# Patient Record
Sex: Female | Born: 1985 | Race: Black or African American | Hispanic: No | Marital: Single | Smoking: Never smoker
Health system: Southern US, Community
[De-identification: ages and names within clinical notes are randomized; demographics above are authoritative.]

## PROBLEM LIST (undated history)

## (undated) HISTORY — PX: BREAST LUMPECTOMY: SHX2

---

## 2015-06-30 ENCOUNTER — Emergency Department (HOSPITAL_COMMUNITY): Payer: Self-pay

## 2015-06-30 ENCOUNTER — Emergency Department (HOSPITAL_COMMUNITY)
Admission: EM | Admit: 2015-06-30 | Discharge: 2015-06-30 | Disposition: A | Discharge status: Home / Self Care | Payer: Self-pay | Attending: Emergency Medicine | Admitting: Emergency Medicine

## 2015-06-30 ENCOUNTER — Encounter (HOSPITAL_COMMUNITY): Payer: Self-pay | Admitting: Emergency Medicine

## 2015-06-30 DIAGNOSIS — J069 Acute upper respiratory infection, unspecified: Secondary | ICD-10-CM | POA: Insufficient documentation

## 2015-06-30 DIAGNOSIS — R197 Diarrhea, unspecified: Secondary | ICD-10-CM | POA: Insufficient documentation

## 2015-06-30 DIAGNOSIS — Z3202 Encounter for pregnancy test, result negative: Secondary | ICD-10-CM | POA: Insufficient documentation

## 2015-06-30 LAB — URINALYSIS, ROUTINE W REFLEX MICROSCOPIC
Bilirubin Urine: NEGATIVE
GLUCOSE, UA: NEGATIVE mg/dL
Hgb urine dipstick: NEGATIVE
Ketones, ur: NEGATIVE mg/dL
LEUKOCYTES UA: NEGATIVE
Nitrite: NEGATIVE
PROTEIN: NEGATIVE mg/dL
Specific Gravity, Urine: 1.004 — ABNORMAL LOW (ref 1.005–1.030)
UROBILINOGEN UA: 0.2 mg/dL (ref 0.0–1.0)
pH: 8 (ref 5.0–8.0)

## 2015-06-30 LAB — COMPREHENSIVE METABOLIC PANEL
ALK PHOS: 49 U/L (ref 38–126)
ALT: 24 U/L (ref 14–54)
ANION GAP: 10 (ref 5–15)
AST: 26 U/L (ref 15–41)
Albumin: 3.8 g/dL (ref 3.5–5.0)
BUN: <5 mg/dL — ABNORMAL LOW (ref 6–20)
CALCIUM: 9.6 mg/dL (ref 8.9–10.3)
CO2: 23 mmol/L (ref 22–32)
Chloride: 106 mmol/L (ref 101–111)
Creatinine, Ser: 0.88 mg/dL (ref 0.44–1.00)
GFR calc non Af Amer: >60 mL/min (ref >60)
Glucose, Bld: 90 mg/dL (ref 65–99)
POTASSIUM: 4.5 mmol/L (ref 3.5–5.1)
SODIUM: 139 mmol/L (ref 135–145)
Total Bilirubin: 0.5 mg/dL (ref 0.3–1.2)
Total Protein: 7.3 g/dL (ref 6.5–8.1)

## 2015-06-30 LAB — CBC
HCT: 39.4 % (ref 36.0–46.0)
HEMOGLOBIN: 12.2 g/dL (ref 12.0–15.0)
MCH: 25.9 pg — AB (ref 26.0–34.0)
MCHC: 31 g/dL (ref 30.0–36.0)
MCV: 83.7 fL (ref 78.0–100.0)
Platelets: 177 10*3/uL (ref 150–400)
RBC: 4.71 MIL/uL (ref 3.87–5.11)
RDW: 13.4 % (ref 11.5–15.5)
WBC: 5.9 10*3/uL (ref 4.0–10.5)

## 2015-06-30 LAB — I-STAT BETA HCG BLOOD, ED (MC, WL, AP ONLY): I-stat hCG, quantitative: <5 m[IU]/mL (ref <5)

## 2015-06-30 LAB — LIPASE, BLOOD: LIPASE: 39 U/L (ref 22–51)

## 2015-06-30 MED ORDER — SODIUM CHLORIDE 0.9 % IV BOLUS (SEPSIS)
1000.0000 mL | Freq: Once | INTRAVENOUS | Status: AC
  Administered 2015-06-30: 1000 mL via INTRAVENOUS

## 2015-06-30 MED ORDER — KETOROLAC TROMETHAMINE 30 MG/ML IJ SOLN
30.0000 mg | Freq: Once | INTRAMUSCULAR | Status: AC
  Administered 2015-06-30: 30 mg via INTRAVENOUS
  Filled 2015-06-30: qty 1

## 2015-06-30 NOTE — ED Provider Notes (Signed)
CSN: [id number]     Arrival date & time 06/30/15  1015 History   First MD Initiated Contact with Patient 06/30/15 1107     Chief Complaint  Patient presents with  . Diarrhea  . URI     (Consider location/radiation/quality/duration/timing/severity/associated sxs/prior Treatment) HPI  29 year old female , otherwise healthy, who presents with cough, congestion, and diarrhea. 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  Initially had a dry cough that developed 2 weeks ago. Over the past week she has had productive cough, with congestion, hoarseness, and myalgias. This is also associated with tension headache. Reports that she had diarrhea starting last night, total 4 episodes. Has not had any nausea , vomiting, or abdominal pain. Denies any dysuria or urinary frequency.   History reviewed. No pertinent past medical history. Past Surgical History  Procedure Laterality Date  . Breast lumpectomy     History reviewed. No pertinent family history. Social History  Substance Use Topics  . Smoking status: Never Smoker   . Smokeless tobacco: None  . Alcohol Use: No   OB History    No data available     Review of Systems 10/14 systems reviewed and are negative other than those stated in the HPI    Allergies  Codeine  Home Medications   Prior to Admission medications   Not on File   BP 107/61 mmHg  Pulse 83  Temp(Src) 99.2 F (37.3 C) (Oral)  Resp 16  Ht 5' 5.5" (1.664 m)  Wt 185 lb (83.915 kg)  BMI 30.31 kg/m2  SpO2 100%  LMP 06/19/2015 (Exact Date) Physical Exam Physical Exam  Nursing note and vitals reviewed. Constitutional: Well developed, well nourished, non-toxic, and in no acute distress Head: Normocephalic and atraumatic.  Mouth/Throat: Oropharynx is clear and moist.  Neck: Normal range of motion. Neck supple. No meningismus. Cardiovascular: Normal rate and regular rhythm.   Pulmonary/Chest: Effort normal  and breath sounds normal.  Abdominal: Soft. There is no tenderness. There is no rebound and no guarding.  Musculoskeletal: Normal range of motion.  Neurological: Alert, no facial droop, fluent speech, moves all extremities symmetrically Skin: Skin is warm and dry.  Psychiatric: Cooperative  ED Course  Procedures (including critical care time) Labs Review Labs Reviewed  COMPREHENSIVE METABOLIC PANEL - Abnormal; Notable for the following:    BUN <5 (*)    All other components within normal limits  CBC - Abnormal; Notable for the following:    MCH 25.9 (*)    All other components within normal limits  URINALYSIS, ROUTINE W REFLEX MICROSCOPIC (NOT AT [facility]) - Abnormal; Notable for the following:    Specific Gravity, Urine 1.004 (*)    All other components within normal limits  LIPASE, BLOOD  I-STAT BETA HCG BLOOD, ED ([facility], [facility], [facility] ONLY)    Imaging Review Dg Chest 2 View  06/30/2015   CLINICAL DATA:  Cough, chills  EXAM: CHEST  2 VIEW  COMPARISON:  None.  FINDINGS: The heart size and mediastinal contours are within normal limits. Both lungs are clear. The visualized skeletal structures are unremarkable.  IMPRESSION: No active cardiopulmonary disease.   Electronically Signed   By: Elige Ko   On: 06/30/2015 11:59   I have personally reviewed and evaluated these images and lab results as part of my medical decision-making.   MDM   Final diagnoses:  Diarrhea of presumed infectious origin  Upper respiratory infection    In short, this  is 29 year old female presenting with flu like symptoms. She is well appearing with unremarkable exam. Blood work and UA unremarkable. Symptoms improved with IV fluids and toradol. CXR showing no PNA. Discussed supportive care management for home. Strict return and follow-up instructions reviewed. She expressed understanding of all discharge instructions and felt comfortable with the plan of care.     Lavera Guise, MD 06/30/15 2010

## 2015-06-30 NOTE — Discharge Instructions (Signed)
Drink plenty of fluids. Return without fail for worsening symptoms, including worsening pain, vomiting and unable to keep down food/fluids, or any other symptoms concerning to you.   Upper Respiratory Infection, Adult Most upper respiratory infections (URIs) are caused by a virus. A URI affects the nose, throat, and upper air passages. The most common type of URI is often called "the common cold." HOME CARE   Take medicines only as told by your doctor.  Gargle warm saltwater or take cough drops to comfort your throat as told by your doctor.  Use a warm mist humidifier or inhale steam from a shower to increase air moisture. This may make it easier to breathe.  Drink enough fluid to keep your pee (urine) clear or pale yellow.  Eat soups and other clear broths.  Have a healthy diet.  Rest as needed.  Go back to work when your fever is gone or your doctor says it is okay.  You may need to stay home longer to avoid giving your URI to others.  You can also wear a face mask and wash your hands often to prevent spread of the virus.  Use your inhaler more if you have asthma.  Do not use any tobacco products, including cigarettes, chewing tobacco, or electronic cigarettes. If you need help quitting, ask your doctor. GET HELP IF:  You are getting worse, not better.  Your symptoms are not helped by medicine.  You have chills.  You are getting more short of breath.  You have brown or red mucus.  You have yellow or brown discharge from your nose.  You have pain in your face, especially when you bend forward.  You have a fever.  You have puffy (swollen) neck glands.  You have pain while swallowing.  You have white areas in the back of your throat. GET HELP RIGHT AWAY IF:   You have very bad or constant:  Headache.  Ear pain.  Pain in your forehead, behind your eyes, and over your cheekbones (sinus pain).  Chest pain.  You have long-lasting (chronic) lung disease and  any of the following:  Wheezing.  Long-lasting cough.  Coughing up blood.  A change in your usual mucus.  You have a stiff neck.  You have changes in your:  Vision.  Hearing.  Thinking.  Mood. MAKE SURE YOU:   Understand these instructions.  Will watch your condition.  Will get help right away if you are not doing well or get worse.   This information is not intended to replace advice given to you by your health care provider. Make sure you discuss any questions you have with your health care provider.   Document Released: 02/26/2008 Document Revised: 01/24/2015 Document Reviewed: 12/15/2013 Elsevier Interactive Patient Education 2016 [employer]  Diarrhea Diarrhea is frequent loose and watery bowel movements. It can cause you to feel weak and dehydrated. Dehydration can cause you to become tired and thirsty, have a dry mouth, and have decreased urination that often is dark yellow. Diarrhea is a sign of another problem, most often an infection that will not last long. In most cases, diarrhea typically lasts 2-3 days. However, it can last longer if it is a sign of something more serious. It is important to treat your diarrhea as directed by your caregiver to lessen or prevent future episodes of diarrhea. CAUSES  Some common causes include:  Gastrointestinal infections caused by viruses, bacteria, or parasites.  Food poisoning or food allergies.  Certain medicines,  such as antibiotics, chemotherapy, and laxatives.  Artificial sweeteners and fructose.  Digestive disorders. HOME CARE INSTRUCTIONS  Ensure adequate fluid intake (hydration): Have 1 cup (8 oz) of fluid for each diarrhea episode. Avoid fluids that contain simple sugars or sports drinks, fruit juices, whole milk products, and sodas. Your urine should be clear or pale yellow if you are drinking enough fluids. Hydrate with an oral rehydration solution that you can purchase at pharmacies, retail stores, and  online. You can prepare an oral rehydration solution at home by mixing the following ingredients together:   - tsp table salt.   tsp baking soda.   tsp salt substitute containing potassium chloride.  1  tablespoons sugar.  1 L (34 oz) of water.  Certain foods and beverages may increase the speed at which food moves through the gastrointestinal (GI) tract. These foods and beverages should be avoided and include:  Caffeinated and alcoholic beverages.  High-fiber foods, such as raw fruits and vegetables, nuts, seeds, and whole grain breads and cereals.  Foods and beverages sweetened with sugar alcohols, such as xylitol, sorbitol, and mannitol.  Some foods may be well tolerated and may help thicken stool including:  Starchy foods, such as rice, toast, pasta, low-sugar cereal, oatmeal, grits, baked potatoes, crackers, and bagels.  Bananas.  Applesauce.  Add probiotic-rich foods to help increase healthy bacteria in the GI tract, such as yogurt and fermented milk products.  Wash your hands well after each diarrhea episode.  Only take over-the-counter or prescription medicines as directed by your caregiver.  Take a warm bath to relieve any burning or pain from frequent diarrhea episodes. SEEK IMMEDIATE MEDICAL CARE IF:   You are unable to keep fluids down.  You have persistent vomiting.  You have blood in your stool, or your stools are black and tarry.  You do not urinate in 6-8 hours, or there is only a small amount of very dark urine.  You have abdominal pain that increases or localizes.  You have weakness, dizziness, confusion, or light-headedness.  You have a severe headache.  Your diarrhea gets worse or does not get better.  You have a fever or persistent symptoms for more than 2-3 days.  You have a fever and your symptoms suddenly get worse. MAKE SURE YOU:   Understand these instructions.  Will watch your condition.  Will get help right away if you are not  doing well or get worse.   This information is not intended to replace advice given to you by your health care provider. Make sure you discuss any questions you have with your health care provider.   Document Released: 08/30/2002 Document Revised: 09/30/2014 Document Reviewed: 05/17/2012 Elsevier Interactive Patient Education [employer]

## 2015-06-30 NOTE — ED Notes (Signed)
Pt c/o cold symptoms and diarrhea x 2 weeks. 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

## 2015-10-19 ENCOUNTER — Emergency Department (HOSPITAL_COMMUNITY)
Admission: EM | Admit: 2015-10-19 | Discharge: 2015-10-19 | Disposition: A | Discharge status: Home / Self Care | Payer: Self-pay | Attending: Emergency Medicine | Admitting: Emergency Medicine

## 2015-10-19 ENCOUNTER — Encounter (HOSPITAL_COMMUNITY): Payer: Self-pay | Admitting: Emergency Medicine

## 2015-10-19 ENCOUNTER — Emergency Department (HOSPITAL_COMMUNITY): Payer: Medicaid Other

## 2015-10-19 DIAGNOSIS — N858 Other specified noninflammatory disorders of uterus: Secondary | ICD-10-CM | POA: Insufficient documentation

## 2015-10-19 DIAGNOSIS — R1031 Right lower quadrant pain: Secondary | ICD-10-CM

## 2015-10-19 DIAGNOSIS — Z3202 Encounter for pregnancy test, result negative: Secondary | ICD-10-CM | POA: Insufficient documentation

## 2015-10-19 DIAGNOSIS — R102 Pelvic and perineal pain: Secondary | ICD-10-CM

## 2015-10-19 DIAGNOSIS — N898 Other specified noninflammatory disorders of vagina: Secondary | ICD-10-CM | POA: Insufficient documentation

## 2015-10-19 DIAGNOSIS — R109 Unspecified abdominal pain: Secondary | ICD-10-CM

## 2015-10-19 LAB — COMPREHENSIVE METABOLIC PANEL
ALT: 17 U/L (ref 14–54)
ANION GAP: 8 (ref 5–15)
AST: 20 U/L (ref 15–41)
Albumin: 3.8 g/dL (ref 3.5–5.0)
Alkaline Phosphatase: 49 U/L (ref 38–126)
BUN: 7 mg/dL (ref 6–20)
CALCIUM: 9.4 mg/dL (ref 8.9–10.3)
CHLORIDE: 106 mmol/L (ref 101–111)
CO2: 25 mmol/L (ref 22–32)
CREATININE: 0.8 mg/dL (ref 0.44–1.00)
Glucose, Bld: 96 mg/dL (ref 65–99)
Potassium: 4.5 mmol/L (ref 3.5–5.1)
SODIUM: 139 mmol/L (ref 135–145)
Total Bilirubin: 0.2 mg/dL — ABNORMAL LOW (ref 0.3–1.2)
Total Protein: 7 g/dL (ref 6.5–8.1)

## 2015-10-19 LAB — CBC
HCT: 38.1 % (ref 36.0–46.0)
HEMOGLOBIN: 12 g/dL (ref 12.0–15.0)
MCH: 26.2 pg (ref 26.0–34.0)
MCHC: 31.5 g/dL (ref 30.0–36.0)
MCV: 83.2 fL (ref 78.0–100.0)
PLATELETS: 195 10*3/uL (ref 150–400)
RBC: 4.58 MIL/uL (ref 3.87–5.11)
RDW: 14.1 % (ref 11.5–15.5)
WBC: 9.4 10*3/uL (ref 4.0–10.5)

## 2015-10-19 LAB — URINALYSIS, ROUTINE W REFLEX MICROSCOPIC
Bilirubin Urine: NEGATIVE
Glucose, UA: NEGATIVE mg/dL
Hgb urine dipstick: NEGATIVE
Ketones, ur: NEGATIVE mg/dL
LEUKOCYTES UA: NEGATIVE
Nitrite: NEGATIVE
PROTEIN: NEGATIVE mg/dL
SPECIFIC GRAVITY, URINE: 1.019 (ref 1.005–1.030)
pH: 7.5 (ref 5.0–8.0)

## 2015-10-19 LAB — I-STAT BETA HCG BLOOD, ED (MC, WL, AP ONLY)

## 2015-10-19 LAB — WET PREP, GENITAL
CLUE CELLS WET PREP: NONE SEEN
Sperm: NONE SEEN
TRICH WET PREP: NONE SEEN
YEAST WET PREP: NONE SEEN

## 2015-10-19 LAB — LIPASE, BLOOD: LIPASE: 40 U/L (ref 11–51)

## 2015-10-19 MED ORDER — OXYCODONE-ACETAMINOPHEN 5-325 MG PO TABS: 1.0000 | ORAL_TABLET | Freq: Once | ORAL | Status: DC

## 2015-10-19 MED ORDER — OXYCODONE-ACETAMINOPHEN 5-325 MG PO TABS
ORAL_TABLET | ORAL | Status: AC
  Filled 2015-10-19: qty 1

## 2015-10-19 MED ORDER — IBUPROFEN 400 MG PO TABS
400.0000 mg | ORAL_TABLET | Freq: Once | ORAL | Status: AC
Start: 2015-10-19 — End: 2015-10-19
  Administered 2015-10-19: 400 mg via ORAL

## 2015-10-19 MED ORDER — IBUPROFEN 400 MG PO TABS
ORAL_TABLET | ORAL | Status: AC
  Filled 2015-10-19: qty 1

## 2015-10-19 NOTE — ED Notes (Signed)
Pt from home for eval of left flank pain with radiation to pelvic region, pt denies any n/v/d or fevers. Pt states LMP was 2 days ago and denies any abnormal vaginal discharge or odor. Pt states has been drinking more coffee than normal and thinks that could be the problem. Denies any urinary symptoms./

## 2015-10-19 NOTE — Discharge Instructions (Signed)
You have been tested for gonorrhea, chlamydia, HIV, and Syphilis, and the hospital will call you if the lab is positive. Do not engage in sexual activity until you find out about these results. Abdominal (belly) pain can be caused by many things. Your labs/ultrasound today were unremarkable, so it's unclear exactly what's causing your symptoms. Try avoiding caffeine/coffee to see if this helps with your abdominal pain. Use tylenol or motrin as needed. Stay well hydrated. Follow up with your regular doctor from your insurance carrier's website in 1 week for recheck of ongoing symptoms and to establish care. Return to the ER for changes or worsening symptoms.  Your caregiver performed an examination and possibly ordered blood/urine tests and imaging (CT scan, x-rays, ultrasound). Many cases can be observed and treated at home after initial evaluation in the emergency department. Even though you are being discharged home, abdominal pain can be unpredictable. Therefore, you need a repeated exam if your pain does not resolve, returns, or worsens. Most patients with abdominal pain don't have to be admitted to the hospital or have surgery, but serious problems like appendicitis and gallbladder attacks can start out as nonspecific pain. Many abdominal conditions cannot be diagnosed in one visit, so follow-up evaluations are very important. SEEK IMMEDIATE MEDICAL ATTENTION IF YOU DEVELOP ANY OF THE FOLLOWING SYMPTOMS:  The pain does not go away or becomes severe.   A temperature above 101 develops.   Repeated vomiting occurs (multiple episodes).   The pain becomes localized to portions of the abdomen. The right side could possibly be appendicitis. In an adult, the left lower portion of the abdomen could be colitis or diverticulitis.   Blood is being passed in stools or vomit (bright red or black tarry stools).   Return also if you develop chest pain, difficulty breathing, dizziness or fainting, or become  confused, poorly responsive, or inconsolable (young children).  The constipation stays for more than 4 days.   There is belly (abdominal) or rectal pain.   You do not seem to be getting better.     Abdominal Pain, Adult Many things can cause belly (abdominal) pain. Most times, the belly pain is not dangerous. Many cases of belly pain can be watched and treated at home. HOME CARE   Do not take medicines that help you go poop (laxatives) unless told to by your doctor.  Only take medicine as told by your doctor.  Eat or drink as told by your doctor. Your doctor will tell you if you should be on a special diet. GET HELP IF:  You do not know what is causing your belly pain.  You have belly pain while you are sick to your stomach (nauseous) or have runny poop (diarrhea).  You have pain while you pee or poop.  Your belly pain wakes you up at night.  You have belly pain that gets worse or better when you eat.  You have belly pain that gets worse when you eat fatty foods.  You have a fever. GET HELP RIGHT AWAY IF:   The pain does not go away within 2 hours.  You keep throwing up (vomiting).  The pain changes and is only in the right or left part of the belly.  You have bloody or tarry looking poop. MAKE SURE YOU:   Understand these instructions.  Will watch your condition.  Will get help right away if you are not doing well or get worse.   This information is not intended to replace  advice given to you by your health care provider. Make sure you discuss any questions you have with your health care provider.   Document Released: 02/26/2008 Document Revised: 09/30/2014 Document Reviewed: 05/19/2013 Elsevier Interactive Patient Education 2016 [employer]  Flank Pain Flank pain is pain in your side. The flank is the area of your side between your upper belly (abdomen) and your back. Pain in this area can be caused by many different things. HOME CARE Home care and  treatment will depend on the cause of your pain.  Rest as told by your doctor.  Drink enough fluids to keep your pee (urine) clear or pale yellow.  Only take medicine as told by your doctor.  Tell your doctor about any changes in your pain.  Follow up with your doctor. GET HELP RIGHT AWAY IF:   Your pain does not get better with medicine.   You have new symptoms or your symptoms get worse.  Your pain gets worse.   You have belly (abdominal) pain.   You are short of breath.   You always feel sick to your stomach (nauseous).   You keep throwing up (vomiting).   You have puffiness (swelling) in your belly.   You feel light-headed or you pass out (faint).   You have blood in your pee.  You have a fever or lasting symptoms for more than 2-3 days.  You have a fever and your symptoms suddenly get worse. MAKE SURE YOU:   Understand these instructions.  Will watch your condition.  Will get help right away if you are not doing well or get worse.   This information is not intended to replace advice given to you by your health care provider. Make sure you discuss any questions you have with your health care provider.   Document Released: 06/18/2008 Document Revised: 09/30/2014 Document Reviewed: 04/23/2012 Elsevier Interactive Patient Education [employer]

## 2015-10-19 NOTE — ED Provider Notes (Signed)
CSN: [id number]     Arrival date & time 10/19/15  1659 History  By signing my name below, I, Judith Moran, attest that this documentation has been prepared under the direction and in the presence of [address] [employer].  Electronically Signed: Murriel Moran, ED Scribe. 10/19/2015. 7:28 PM.     Chief Complaint  Patient presents with  . Abdominal Pain      Patient is a 30 y.o. female presenting with abdominal pain. The history is provided by the patient. No language interpreter was used.  Abdominal Pain Pain location:  RLQ Pain quality: cramping and dull   Pain radiates to:  Does not radiate Pain severity:  Moderate Onset quality:  Gradual Duration:  2 weeks Timing:  Constant Progression:  Unchanged Chronicity:  New Context: not recent sexual activity, not recent travel, not sick contacts and not suspicious food intake   Context comment:  Increased coffee intake Relieved by:  Acetaminophen and NSAIDs Worsened by:  Nothing tried Ineffective treatments:  None tried Associated symptoms: vaginal discharge (clear, typical for after menstrual cycle)   Associated symptoms: no chest pain, no chills, no constipation, no diarrhea, no dysuria, no fever, no flatus, no hematemesis, no hematochezia, no hematuria, no melena, no nausea, no shortness of breath, no vaginal bleeding and no vomiting   Risk factors: no alcohol abuse, has not had multiple surgeries and no NSAID use    HPI Comments: Judith Moran is a 30 y.o. female who presents to the Emergency Department complaining of gradual onset 5/10 unchanged constant sharp left flank pain and dull/crampy RLQ abdominal pain, which are both nonradiating (and denies that one radiates to the other) that has been present for two weeks. Pt states she has been taking Tylenol and Motrin with moderate relief. Pt states that nothing makes her pain worse, but she attributes this pain to drinking more coffee recently. Pt reports her last menstrual cycle ended  two days ago, and reports that she always has clear vaginal discharge after her menstrual cycle ends, which she is currently having, stating it's scant and nonodorous. Pt denies fever, chills, chest pain, SOB, n/v/d/c, obstipation, melena, hematochezia, dysuria, hematuria, urinary frequency, vaginal bleeding, vaginal itching, myalgias, arthralgias, numbness, tingling, or weakness. Sexually active with 1 female partner in the last year, protected, but no sexual activity recently. Denies recent travel, sick contacts, suspicious food intake, EtOH use, NSAID use, or prior abd surgeries.   History reviewed. No pertinent past medical history. Past Surgical History  Procedure Laterality Date  . Breast lumpectomy     No family history on file. Social History  Substance Use Topics  . Smoking status: Never Smoker   . Smokeless tobacco: None  . Alcohol Use: No   OB History    No data available     Review of Systems  Constitutional: Negative for fever and chills.  Respiratory: Negative for shortness of breath.   Cardiovascular: Negative for chest pain.  Gastrointestinal: Positive for abdominal pain. Negative for nausea, vomiting, diarrhea, constipation, blood in stool, melena, hematochezia, anal bleeding, flatus and hematemesis.  Genitourinary: Positive for flank pain (L flank) and vaginal discharge (clear, typical for after menstrual cycle). Negative for dysuria, frequency, hematuria, vaginal bleeding, genital sores and vaginal pain.  Musculoskeletal: Negative for myalgias and arthralgias.  Skin: Negative for color change.  Allergic/Immunologic: Negative for immunocompromised state.  Neurological: Negative for weakness and numbness.  Psychiatric/Behavioral: Negative for confusion.  10 Systems reviewed and all are negative for acute change except as noted  in the HPI.    Allergies  Codeine  Home Medications   Prior to Admission medications   Not on File   BP 130/80 mmHg  Pulse 80   Temp(Src) 97.8 F (36.6 C) (Oral)  Resp 18  Ht 5\' 5"  (1.651 m)  SpO2 100%  LMP 10/16/2015 Physical Exam  Constitutional: She is oriented to person, place, and time. Vital signs are normal. She appears well-developed and well-nourished.  Non-toxic appearance. No distress.  Afebrile, nontoxic, NAD  HENT:  Head: Normocephalic and atraumatic.  Mouth/Throat: Oropharynx is clear and moist and mucous membranes are normal.  Eyes: Conjunctivae and EOM are normal. Right eye exhibits no discharge. Left eye exhibits no discharge.  Neck: Normal range of motion. Neck supple.  Cardiovascular: Normal rate, regular rhythm, normal heart sounds and intact distal pulses.  Exam reveals no gallop and no friction rub.   No murmur heard. Pulmonary/Chest: Effort normal and breath sounds normal. No respiratory distress. She has no decreased breath sounds. She has no wheezes. She has no rhonchi. She has no rales.  Abdominal: Soft. Normal appearance and bowel sounds are normal. She exhibits no distension. There is tenderness in the right lower quadrant. There is no rigidity, no rebound, no guarding, no CVA tenderness, no tenderness at McBurney's point and negative Murphy's sign.    Soft, nondistended, +BS throughout, with very mild RLQ TTP near pelvic brim, no r/g/r, neg murphy's, neg mcburney's, no CVA TTP, negative psoas sign, negative foot tap test  Genitourinary: Uterus normal. Pelvic exam was performed with patient supine. There is no rash, tenderness or lesion on the right labia. There is no rash, tenderness or lesion on the left labia. Cervix exhibits discharge (clear). Cervix exhibits no motion tenderness and no friability. Right adnexum displays tenderness. Right adnexum displays no mass and no fullness. Left adnexum displays no mass, no tenderness and no fullness. No erythema, tenderness or bleeding in the vagina. Vaginal discharge found.  Chaperone present for exam. No rashes, lesions, or tenderness to  external genitalia. No erythema, injury, or tenderness to vaginal mucosa. Scant white/mucoid vaginal discharge without bleeding within vaginal vault. No adnexal masses or fullness, no L adnexal tenderness, but some R adnexal tenderness. No CMT or cervical friability, with scant clear discharge from cervical os. Uterus non-deviated, mobile, nonTTP, and without enlargement.   Musculoskeletal: Normal range of motion.  Neurological: She is alert and oriented to person, place, and time. She has normal strength. No sensory deficit.  Skin: Skin is warm, dry and intact. No rash noted.  Psychiatric: She has a normal mood and affect.  Nursing note and vitals reviewed.   ED Course  Procedures (including critical care time)  DIAGNOSTIC STUDIES: Oxygen Saturation is 100% on room air, normal by my interpretation.    COORDINATION OF CARE: 7:11 PM Discussed treatment plan with pt at bedside and pt agreed to plan.   Labs Review Labs Reviewed  WET PREP, GENITAL - Abnormal; Notable for the following:    WBC, Wet Prep HPF POC FEW (*)    All other components within normal limits  COMPREHENSIVE METABOLIC PANEL - Abnormal; Notable for the following:    Total Bilirubin 0.2 (*)    All other components within normal limits  LIPASE, BLOOD  CBC  URINALYSIS, ROUTINE W REFLEX MICROSCOPIC (NOT AT [facility])  RPR  HIV ANTIBODY (ROUTINE TESTING)  I-STAT BETA HCG BLOOD, ED ([facility], [facility], [facility] ONLY)  GC/CHLAMYDIA PROBE AMP ([place]) NOT AT [facility]    Imaging Review  US Transvaginal Non-ob  10/19/2015  CLINICAL DATA:  Right adnexal tenderness. EXAM: TRANSABDOMINAL AND TRANSVAGINAL ULTRASOUND OF PELVIS DOPPLER ULTRASOUND OF OVARIES TECHNIQUE: Both transabdominal and transvaginal ultrasound examinations of the pelvis were performed. Transabdominal technique was performed for global imaging of the pelvis including uterus, ovaries, adnexal regions, and pelvic cul-de-sac. It was necessary to proceed with endovaginal exam following  the transabdominal exam to visualize the uterus and ovaries. Color and duplex Doppler ultrasound was utilized to evaluate blood flow to the ovaries. COMPARISON:  None. FINDINGS: Uterus Measurements: 7.2 x 3.4 x 4.6 cm. Uterus is anteverted. Small nabothian cysts in the cervix. No fibroids or other mass visualized. Endometrium Thickness: 12 mm.  No focal abnormality visualized. Right ovary Measurements: 3.7 x 2.2 x 2.2 cm. Normal appearance/no adnexal mass. Left ovary Measurements: 2.6 x 2.4 x 2.4 cm. Normal appearance/no adnexal mass. Pulsed Doppler evaluation of both ovaries demonstrates normal low-resistance arterial and venous waveforms. Flow is demonstrated in both ovaries on color flow Doppler imaging. Other findings No abnormal free fluid. IMPRESSION: Normal ultrasound appearance of the uterus and ovaries. Electronically Signed   By: Burman Nieves M.D.   On: 10/19/2015 21:06   US Pelvis Complete  10/19/2015  CLINICAL DATA:  Right adnexal tenderness. EXAM: TRANSABDOMINAL AND TRANSVAGINAL ULTRASOUND OF PELVIS DOPPLER ULTRASOUND OF OVARIES TECHNIQUE: Both transabdominal and transvaginal ultrasound examinations of the pelvis were performed. Transabdominal technique was performed for global imaging of the pelvis including uterus, ovaries, adnexal regions, and pelvic cul-de-sac. It was necessary to proceed with endovaginal exam following the transabdominal exam to visualize the uterus and ovaries. Color and duplex Doppler ultrasound was utilized to evaluate blood flow to the ovaries. COMPARISON:  None. FINDINGS: Uterus Measurements: 7.2 x 3.4 x 4.6 cm. Uterus is anteverted. Small nabothian cysts in the cervix. No fibroids or other mass visualized. Endometrium Thickness: 12 mm.  No focal abnormality visualized. Right ovary Measurements: 3.7 x 2.2 x 2.2 cm. Normal appearance/no adnexal mass. Left ovary Measurements: 2.6 x 2.4 x 2.4 cm. Normal appearance/no adnexal mass. Pulsed Doppler evaluation of both ovaries  demonstrates normal low-resistance arterial and venous waveforms. Flow is demonstrated in both ovaries on color flow Doppler imaging. Other findings No abnormal free fluid. IMPRESSION: Normal ultrasound appearance of the uterus and ovaries. Electronically Signed   By: Burman Nieves M.D.   On: 10/19/2015 21:06   [place] Art/ven Flow Abd Pelv Doppler  10/19/2015  CLINICAL DATA:  Right adnexal tenderness. EXAM: TRANSABDOMINAL AND TRANSVAGINAL ULTRASOUND OF PELVIS DOPPLER ULTRASOUND OF OVARIES TECHNIQUE: Both transabdominal and transvaginal ultrasound examinations of the pelvis were performed. Transabdominal technique was performed for global imaging of the pelvis including uterus, ovaries, adnexal regions, and pelvic cul-de-sac. It was necessary to proceed with endovaginal exam following the transabdominal exam to visualize the uterus and ovaries. Color and duplex Doppler ultrasound was utilized to evaluate blood flow to the ovaries. COMPARISON:  None. FINDINGS: Uterus Measurements: 7.2 x 3.4 x 4.6 cm. Uterus is anteverted. Small nabothian cysts in the cervix. No fibroids or other mass visualized. Endometrium Thickness: 12 mm.  No focal abnormality visualized. Right ovary Measurements: 3.7 x 2.2 x 2.2 cm. Normal appearance/no adnexal mass. Left ovary Measurements: 2.6 x 2.4 x 2.4 cm. Normal appearance/no adnexal mass. Pulsed Doppler evaluation of both ovaries demonstrates normal low-resistance arterial and venous waveforms. Flow is demonstrated in both ovaries on color flow Doppler imaging. Other findings No abnormal free fluid. IMPRESSION: Normal ultrasound appearance of the uterus and ovaries. Electronically Signed   By:  Burman Nieves M.D.   On: 10/19/2015 21:06   I have personally reviewed and evaluated these images and lab results as part of my medical decision-making.   EKG Interpretation None      MDM   Final diagnoses:  Right adnexal tenderness  Left flank pain  RLQ abdominal pain  Vaginal  discharge    30 y.o. female here with 2wks of L flank pain and RLQ pain (although not radiating from one to the other), states she thinks it is because she's been drinking more coffee lately. States she just had her LMP and has clear vaginal discharge now but states this is typical for after her menses. On exam, mild RLQ tenderness, no flank tenderness. BetaHCG neg. Lipase WNL, U/A clear, CMP WNL, and CBC WNL. Will proceed with pelvic exam/STD check. Doubt need for imaging, doubt appendicitis given duration of symptoms and lack of leukocytosis or other findings. Doubt torsion since pt has had this for 2 wks. Unclear etiology at this time but will perform pelvic and then decide on any further studies. Will reassess shortly. She declines anything for pain since she was given ibuprofen in triage which helped  8:00 PM Pelvic exam reveals some R adnexal tenderness, scant white/mucoid vaginal discharge with clear cervical discharge, no CMT or cervical friability. Will await wet prep, doubt need for empiric GC/CT at this time but will await wet prep to see if any abnormal findings are seen. Will proceed with pelvic U/S given the R adnexal tenderness, to further evaluate the etiology of symptoms. She declines wanting anything at this time. Will reassess shortly.   9:13 PM Wet prep with no yeast/clue cells/trich. Few WBCs. Doubt need for empiric tx of GC/CT given low-risk sexual behaviors and no significant discharge or concerning findings noted. Pelvic U/S negative, no clear etiology of symptoms, but given that it's been ongoing x2 wks with no emergent causes identified, doubt need for further evaluation. Will have her f/up with PCP in 1wk for recheck and ongoing evaluation. Discussed avoidance of caffeine use, stay well hydrated, use tylenol/motrin for pain, and avoid sexual intercourse until she hears back about GC/CT/HIV/RPR testing. I explained the diagnosis and have given explicit precautions to return to the  ER including for any other new or worsening symptoms. The patient understands and accepts the medical plan as it's been dictated and I have answered their questions. Discharge instructions concerning home care and prescriptions have been given. The patient is STABLE and is discharged to home in good condition.   I personally performed the services described in this documentation, which was scribed in my presence. The recorded information has been reviewed and is accurate.   BP 130/80 mmHg  Pulse 80  Temp(Src) 97.8 F (36.6 C) (Oral)  Resp 18  Ht  (1.651 m)  SpO2 100%  LMP 10/16/2015  Meds ordered this encounter  Medications  . ibuprofen (ADVIL,MOTRIN) tablet 400 mg    Sig:   . ibuprofen (ADVIL,MOTRIN) 400 MG tablet    Sig:     Kizzie Bane, Danielle   : cabinet override      Keyna Blizard Camprubi-Soms, PA-C 10/19/15 2118  Donnetta Hutching, MD 10/20/15 [phone]

## 2015-10-20 LAB — GC/CHLAMYDIA PROBE AMP (~~LOC~~) NOT AT ARMC
Chlamydia: NEGATIVE
Neisseria Gonorrhea: NEGATIVE

## 2015-10-20 LAB — RPR: RPR Ser Ql: NONREACTIVE

## 2015-10-20 LAB — HIV ANTIBODY (ROUTINE TESTING W REFLEX): HIV Screen 4th Generation wRfx: NONREACTIVE

## 2016-07-13 IMAGING — US US PELVIS COMPLETE
1 series · 13 of 25 positions shown · non-contrast
Comparison: None.

CLINICAL DATA: Right adnexal tenderness.

EXAM:
TRANSABDOMINAL AND TRANSVAGINAL ULTRASOUND OF PELVIS
DOPPLER ULTRASOUND OF OVARIES
TECHNIQUE: Both transabdominal and transvaginal ultrasound examinations of the
pelvis were performed. Transabdominal technique was performed for
global imaging of the pelvis including uterus, ovaries, adnexal
regions, and pelvic cul-de-sac.
It was necessary to proceed with endovaginal exam following the
transabdominal exam to visualize the uterus and ovaries. Color and
duplex Doppler ultrasound was utilized to evaluate blood flow to the
ovaries.

[Series 1: us pelvis complete · 0.25mm/px · 13 of 57 slices shown]
[im 1/57]
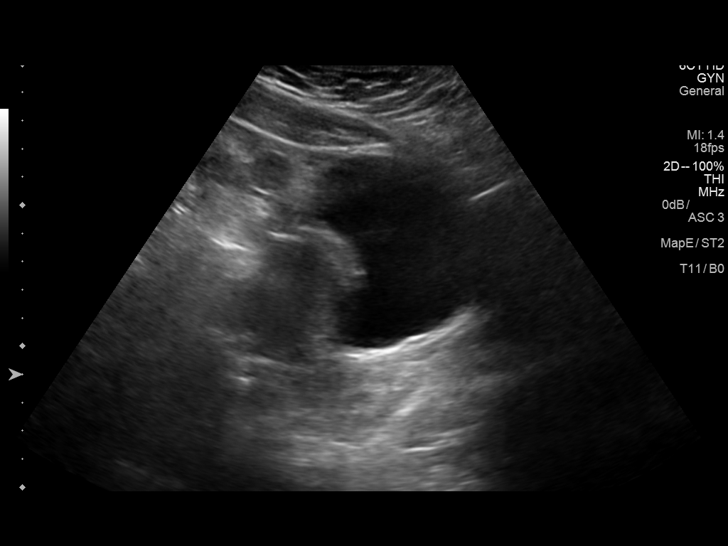
[im 5/57]
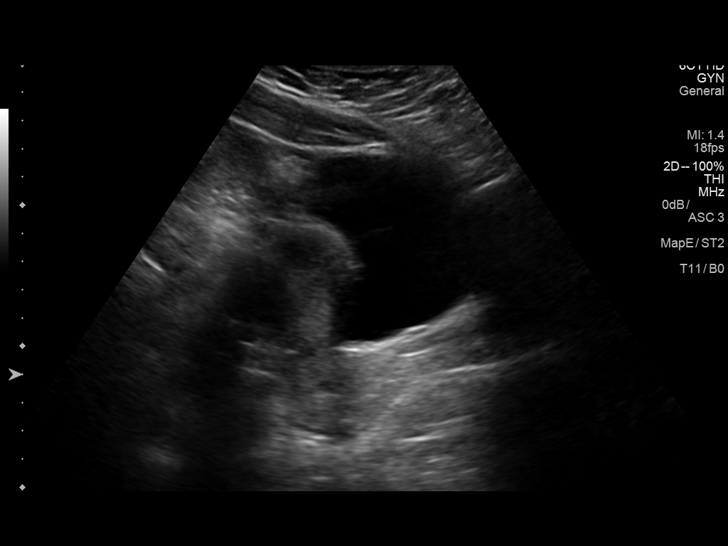
[im 10/57]
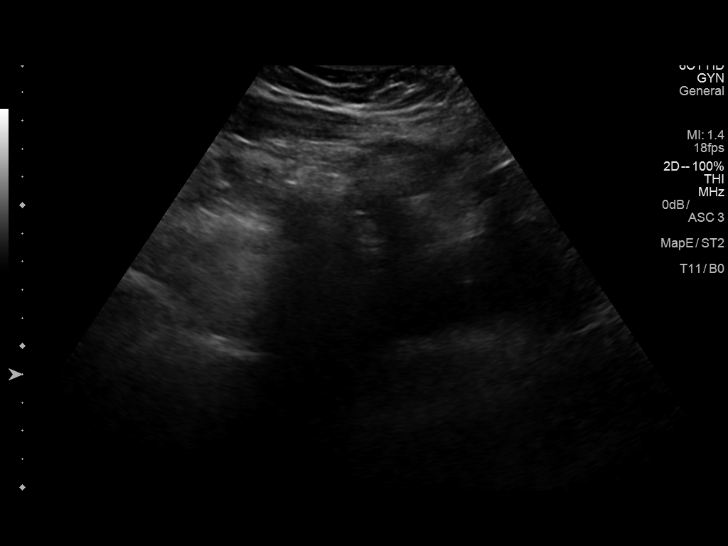
[im 15/57]
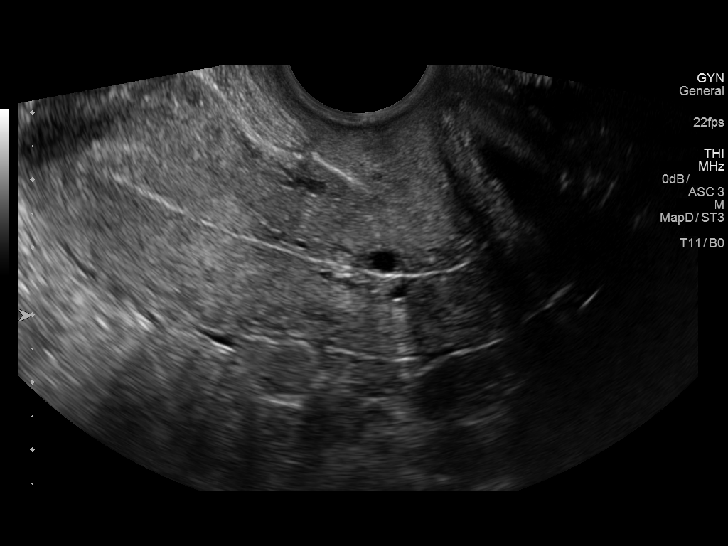
[im 19/57]
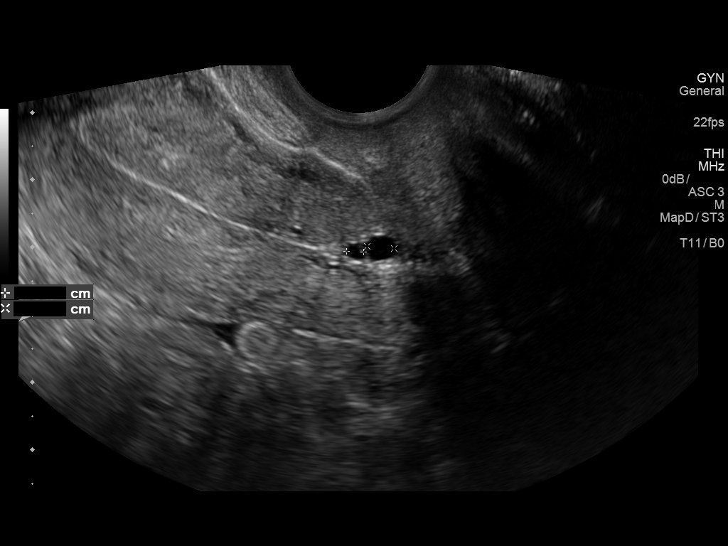
[im 24/57]
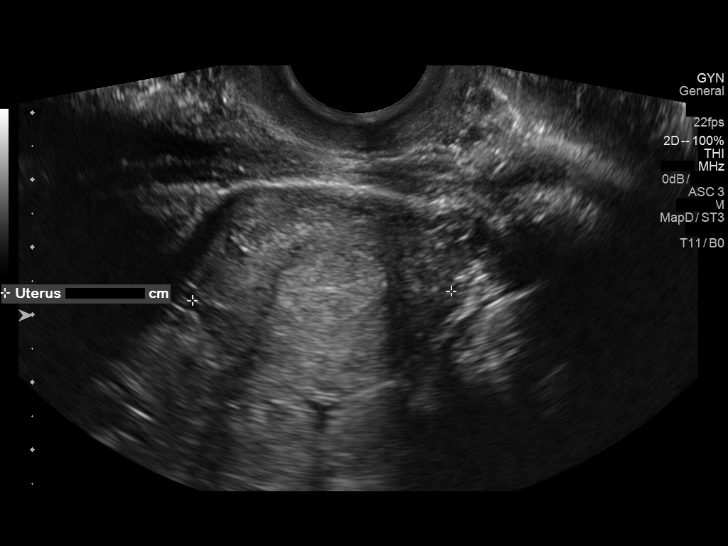
[im 29/57]
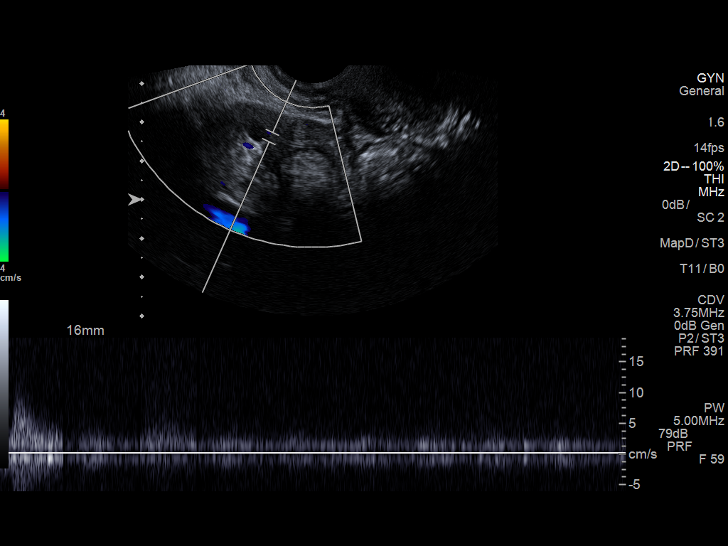
[im 33/57]
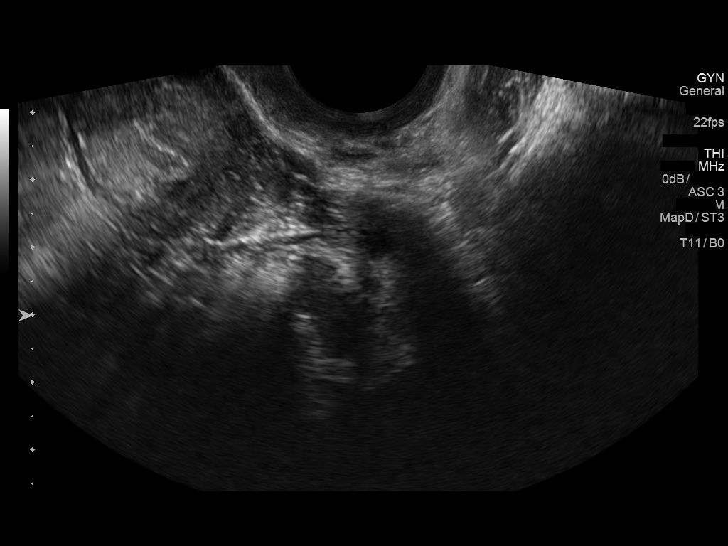
[im 38/57]
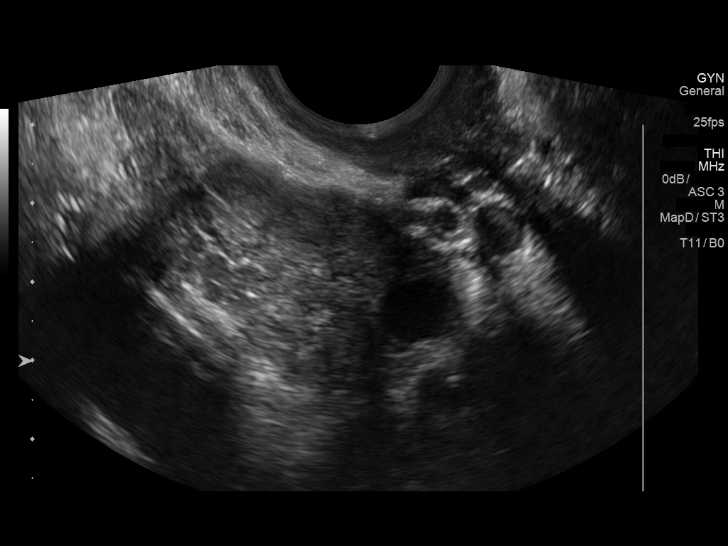
[im 43/57]
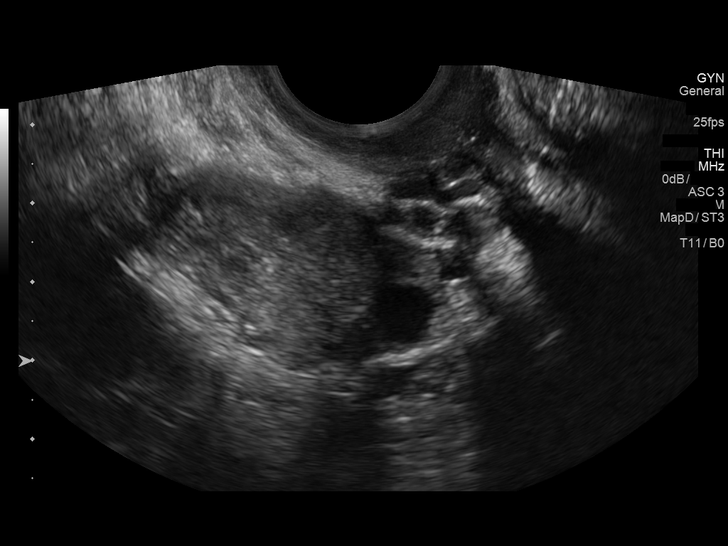
[im 47/57]
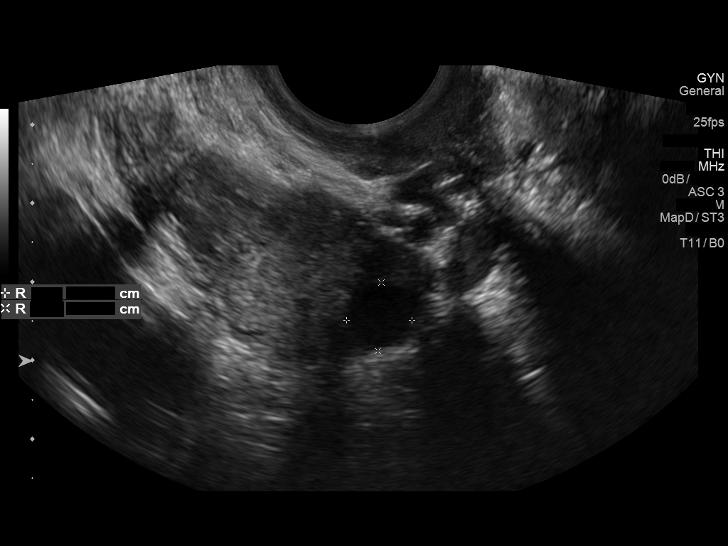
[im 52/57]
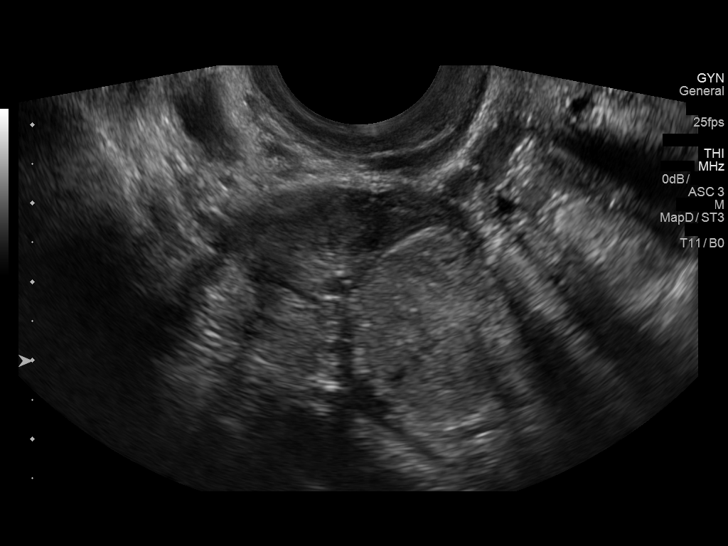
[im 57/57]
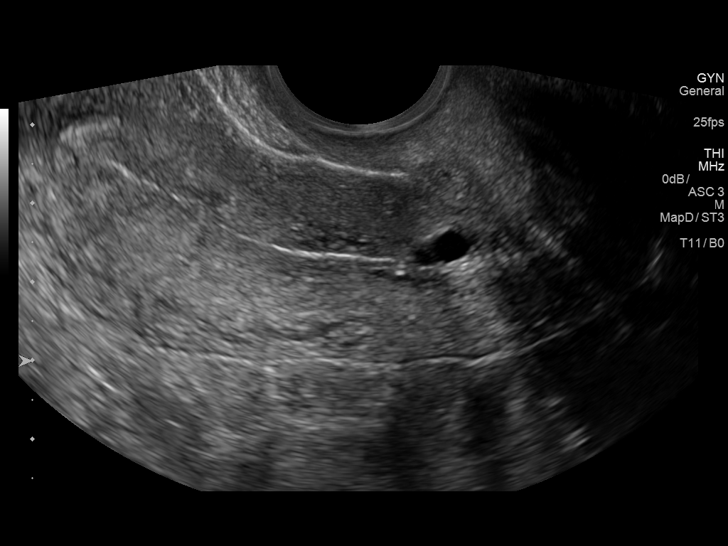

[13 of 25 positions shown; findings below may reference images not displayed]

FINDINGS: Uterus

Measurements: 7.2 x 3.4 x 4.6 cm. Uterus is anteverted. Small
nabothian cysts in the cervix. No fibroids or other mass visualized.

Endometrium

Thickness: 12 mm.  No focal abnormality visualized.

Right ovary

Measurements: 3.7 x 2.2 x 2.2 cm. Normal appearance/no adnexal mass.

Left ovary

Measurements: 2.6 x 2.4 x 2.4 cm. Normal appearance/no adnexal mass.

Pulsed Doppler evaluation of both ovaries demonstrates normal
low-resistance arterial and venous waveforms. Flow is demonstrated
in both ovaries on color flow Doppler imaging.

Other findings

No abnormal free fluid.
IMPRESSION: Normal ultrasound appearance of the uterus and ovaries.

## 2018-02-03 ENCOUNTER — Encounter (HOSPITAL_COMMUNITY): Payer: Self-pay | Admitting: Emergency Medicine

## 2018-02-03 ENCOUNTER — Emergency Department (HOSPITAL_COMMUNITY)
Admission: EM | Admit: 2018-02-03 | Discharge: 2018-02-03 | Disposition: A | Discharge status: Home / Self Care | Payer: BC Managed Care – PPO | Attending: Emergency Medicine | Admitting: Emergency Medicine

## 2018-02-03 ENCOUNTER — Other Ambulatory Visit: Payer: Self-pay

## 2018-02-03 ENCOUNTER — Emergency Department (HOSPITAL_COMMUNITY): Payer: BC Managed Care – PPO

## 2018-02-03 DIAGNOSIS — Z79899 Other long term (current) drug therapy: Secondary | ICD-10-CM | POA: Diagnosis not present

## 2018-02-03 DIAGNOSIS — R0789 Other chest pain: Secondary | ICD-10-CM | POA: Diagnosis present

## 2018-02-03 LAB — I-STAT BETA HCG BLOOD, ED (MC, WL, AP ONLY)

## 2018-02-03 LAB — BASIC METABOLIC PANEL
ANION GAP: 9 (ref 5–15)
BUN: 10 mg/dL (ref 6–20)
CO2: 23 mmol/L (ref 22–32)
Calcium: 9.3 mg/dL (ref 8.9–10.3)
Chloride: 110 mmol/L (ref 101–111)
Creatinine, Ser: 0.81 mg/dL (ref 0.44–1.00)
GLUCOSE: 94 mg/dL (ref 65–99)
POTASSIUM: 3.9 mmol/L (ref 3.5–5.1)
SODIUM: 142 mmol/L (ref 135–145)

## 2018-02-03 LAB — CBC
HEMATOCRIT: 38.8 % (ref 36.0–46.0)
HEMOGLOBIN: 12.3 g/dL (ref 12.0–15.0)
MCH: 25.9 pg — ABNORMAL LOW (ref 26.0–34.0)
MCHC: 31.7 g/dL (ref 30.0–36.0)
MCV: 81.9 fL (ref 78.0–100.0)
Platelets: 191 10*3/uL (ref 150–400)
RBC: 4.74 MIL/uL (ref 3.87–5.11)
RDW: 14.4 % (ref 11.5–15.5)
WBC: 7.1 10*3/uL (ref 4.0–10.5)

## 2018-02-03 LAB — I-STAT TROPONIN, ED: TROPONIN I, POC: 0 ng/mL (ref 0.00–0.08)

## 2018-02-03 MED ORDER — KETOROLAC TROMETHAMINE 30 MG/ML IJ SOLN
30.0000 mg | Freq: Once | INTRAMUSCULAR | Status: AC
  Administered 2018-02-03: 30 mg via INTRAMUSCULAR
  Filled 2018-02-03: qty 1

## 2018-02-03 NOTE — Discharge Instructions (Signed)
Your evaluation today is been very reassuring, labs, EKG and chest x-ray do not suggest an acute problem with your heart or lungs causing your chest pain today.  Pain is likely due to inflammation in the chest wall, please continue taking Aleve twice daily, you may use ice and heat as well.  Follow-up with your primary care doctor.  Return to the emergency department if chest pain worsens, radiates to the arm, neck or jaw, is worse with exertion, or when you take a deep breath, if you feel you are going to pass out or any other new or concerning symptoms occur.

## 2018-02-03 NOTE — ED Provider Notes (Signed)
[facility]PT Provider Note   CSN: [id number] Arrival date & time: 02/03/18  5621     History   Chief Complaint Chief Complaint  Patient presents with  . Chest Pain    HPI Judith Moran is a 33 y.o. female.  Judith Moran is a 32 y.o. Female who is otherwise healthy, presents to the emergency department for evaluation of chest pain.  Patient reports yesterday evening when she was leaving work she noted an aching pain in the middle of her chest.  Pain was made worse with movement or palpitation and she noted pain when she was taking a shower trying to take off her bra yesterday.  She took some Aleve which did not relieve the pain completely, and woke up with continued pain this morning. Has not tried anything else to treat her symptoms, no other aggravating or alleviating factors.  She denies sensation of chest pressure, pain is nonradiating, nonexertional.  Pain is not worse with deep breath, she denies any lower extremity swelling or pain, no recent surgeries or long distance travel, patient is not on any estrogen contraceptive, no history of DVT or PE.  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  No cough, fevers or chills, rhinorrhea or congestion.  No associated nausea, vomiting, diaphoresis or abdominal pain.  Patient denies lightheadedness, dizziness or syncope.  No numbness, tingling or paresthesias in any of her extremities.  No history of similar pain.       History reviewed. No pertinent past medical history.  There are no active problems to display for this patient.   Past Surgical History:  Procedure Laterality Date  . BREAST LUMPECTOMY       OB History   None      Home Medications    Prior to Admission medications   Medication Sig Start Date End Date Taking? Authorizing Provider  Levonorgestrel (SKYLA) 13.5 MG IUD 1 application by Intrauterine route once.     [provider]      Family History History reviewed. No pertinent family history.  Social History Social History   Tobacco Use  . Smoking status: Never Smoker  . Smokeless tobacco: Never Used  Substance Use Topics  . Alcohol use: No  . Drug use: No     Allergies   Codeine   Review of Systems Review of Systems  Constitutional: Negative for chills.  HENT: Negative for congestion, rhinorrhea and sore throat.   Eyes: Negative for visual disturbance.  Respiratory: Negative for cough, chest tightness, shortness of breath and wheezing.   Cardiovascular: Positive for chest pain. Negative for palpitations and leg swelling.  Gastrointestinal: Negative for abdominal pain, diarrhea, nausea and vomiting.  Genitourinary: Negative for dysuria and frequency.  Musculoskeletal: Negative for arthralgias and myalgias.  Skin: Negative for color change and rash.  Neurological: Negative for syncope, weakness, light-headedness and numbness.     Physical Exam Updated Vital Signs BP 117/76 (BP Location: Left Arm)   Pulse 89   Temp 97.9 F (36.6 C) (Oral)   Resp 14   Ht  (1.651 m)   Wt 80.7 kg (178 lb)   SpO2 98%   BMI 29.62 kg/m   Physical Exam  Constitutional: She is oriented to person, place, and time. She appears well-developed and well-nourished. No distress.  HENT:  Head: Normocephalic and atraumatic.  Eyes: Pupils are equal, round, and reactive to light. EOM are normal. Right eye exhibits no discharge.  Left eye exhibits no discharge.  Neck: Normal range of motion. Neck supple. No JVD present. No tracheal deviation present.  Cardiovascular: Normal rate, regular rhythm, normal heart sounds and intact distal pulses. Exam reveals no gallop and no friction rub.  No murmur heard. Pulses:      Carotid pulses are 2+ on the right side, and 2+ on the left side.      Dorsalis pedis pulses are 2+ on the right side, and 2+ on the left side.       Posterior tibial pulses are 2+ on the right side, and  2+ on the left side.  Pulmonary/Chest: Effort normal and breath sounds normal. No stridor. No respiratory distress. She has no wheezes. She has no rales. She exhibits tenderness.  Respirations equal and unlabored, patient able to speak in full sentences, lungs clear to auscultation bilaterally Consistently reproducible tenderness over the mid sternum, no overlying erythema, or ecchymosis, no tenderness over the ribs, or clavicles.  No palpable crepitus or deformity    Abdominal: Soft. Bowel sounds are normal. She exhibits no distension and no mass. There is no tenderness. There is no guarding.  Abdomen soft, nondistended, nontender to palpation in all quadrants without guarding or peritoneal signs  Musculoskeletal: She exhibits no edema or deformity.  Bilateral lower extremities without tenderness or appreciable edema  Neurological: She is alert and oriented to person, place, and time. Coordination normal.  Skin: Skin is warm and dry. Capillary refill takes less than 2 seconds. She is not diaphoretic.  Psychiatric: She has a normal mood and affect. Her behavior is normal.  Nursing note and vitals reviewed.    ED Treatments / Results  Labs (all labs ordered are listed, but only abnormal results are displayed) Labs Reviewed  CBC - Abnormal; Notable for the following components:      Result Value   MCH 25.9 (*)    All other components within normal limits  BASIC METABOLIC PANEL  I-STAT TROPONIN, ED  I-STAT BETA HCG BLOOD, ED ([facility], [facility], [facility] ONLY)    EKG EKG Interpretation  Date/Time:  Tuesday Feb 03 2018 06:52:18 EDT Ventricular Rate:  92 PR Interval:    QRS Duration: 82 QT Interval:  351 QTC Calculation: 435 R Axis:   83 Text Interpretation:  Sinus rhythm Baseline wander in lead(s) V5 No previous tracing Confirmed by Cathren Laine ([id number]) on 02/03/2018 11:20:03 AM   Radiology Dg Chest 2 View  Result Date: 02/03/2018 CLINICAL DATA:  New onset chest pain last night. EXAM: CHEST -  2 VIEW COMPARISON:  Chest x-ray dated June 30, 2015. FINDINGS: The heart at the upper limits of normal in size. Normal mediastinal contours. Both lungs are clear. The visualized skeletal structures are unremarkable. IMPRESSION: No active cardiopulmonary disease. Electronically Signed   By: Obie Dredge M.D.   On: 02/03/2018 07:33    Procedures Procedures (including critical care time)  Medications Ordered in ED Medications  ketorolac (TORADOL) 30 MG/ML injection 30 mg (30 mg Intramuscular Given 02/03/18 1205)     Initial Impression / Assessment and Plan / ED Course  I have reviewed the triage vital signs and the nursing notes.  Pertinent labs & imaging results that were available during my care of the patient were reviewed by me and considered in my medical decision making (see chart for details).  Patient is to be discharged with recommendation to follow up with PCP in regards to today's hospital visit. Chest pain is not likely of cardiac or pulmonary  etiology d/t presentation, PERC negative, VSS, no tracheal deviation, no JVD or new murmur, RRR, breath sounds equal bilaterally, EKG without acute abnormalities, negative troponin, and negative CXR.  Chest pain is consistently reproducible on exam with palpation of the mid sternum, pain improved in ED with NSAIDs.  Pt has been advised to return to the ED if CP becomes exertional, associated with diaphoresis or nausea, radiates to left jaw/arm, is associated with shortness of breath or becomes pleuritic, worsens or becomes concerning in any way.  Continue to treat with NSAIDs, patient to follow-up with her primary care doctor.  Pt appears reliable for follow up and is agreeable to discharge.   Final Clinical Impressions(s) / ED Diagnoses   Final diagnoses:  Chest wall pain    ED Discharge Orders    None       Legrand Rams 02/03/18 1222    Cathren Laine, MD 02/04/18 1200

## 2018-02-03 NOTE — ED Triage Notes (Signed)
Pt states yesterday when she was leaving work her chest was hurting  States she went home and rested and took a shower and her chest was still hurting  Pt states the pain woke her up this morning  States the pain is in the center of her chest and is unable to describe what it feels like   Pt states her heart rate was higher this morning than normal

## 2019-12-06 IMAGING — CR DG CHEST 2V
2 series · 2 of 2 positions shown · non-contrast
Comparison: Chest x-ray dated June 30, 2015.

CLINICAL DATA: New onset chest pain last night.

EXAM:
CHEST - 2 VIEW

[w chest pa]
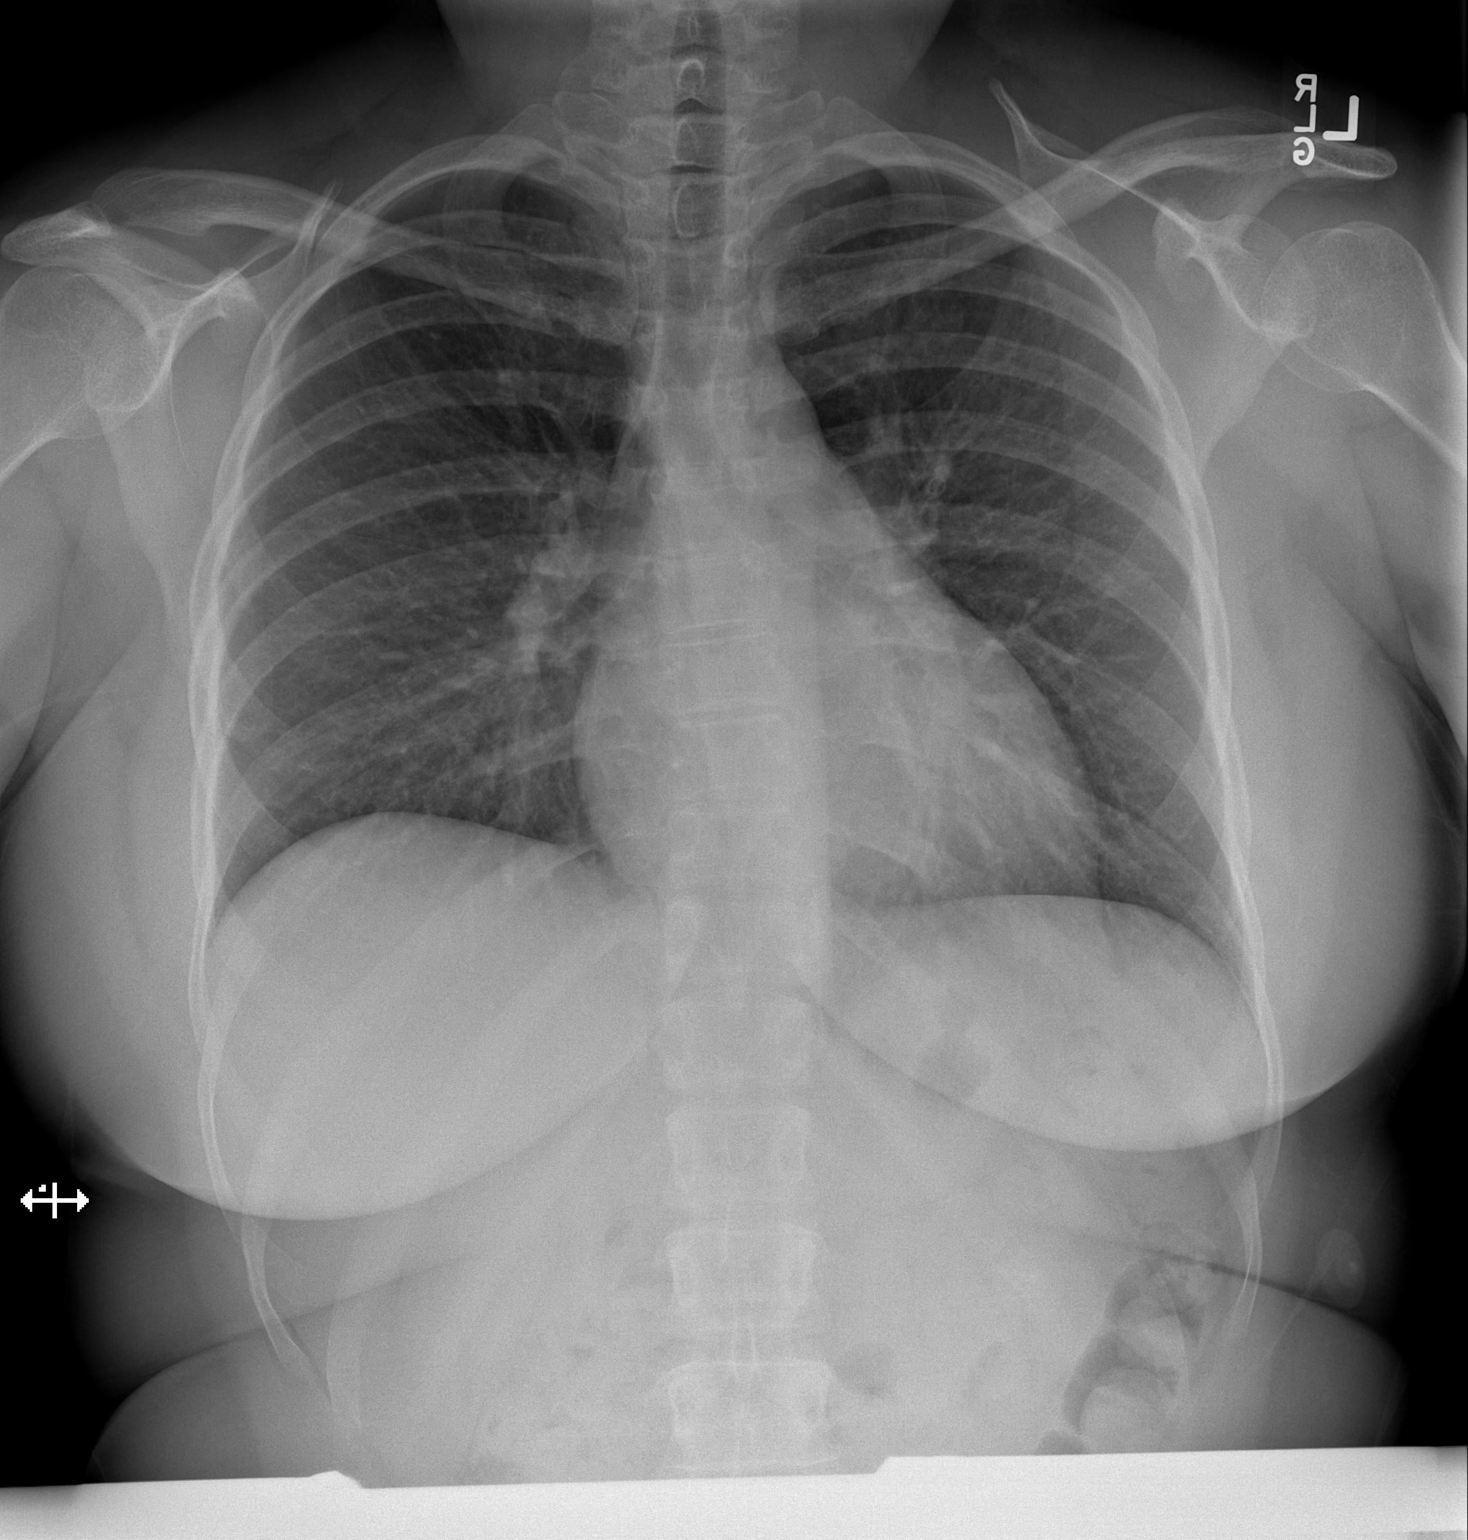

[w chest lat]
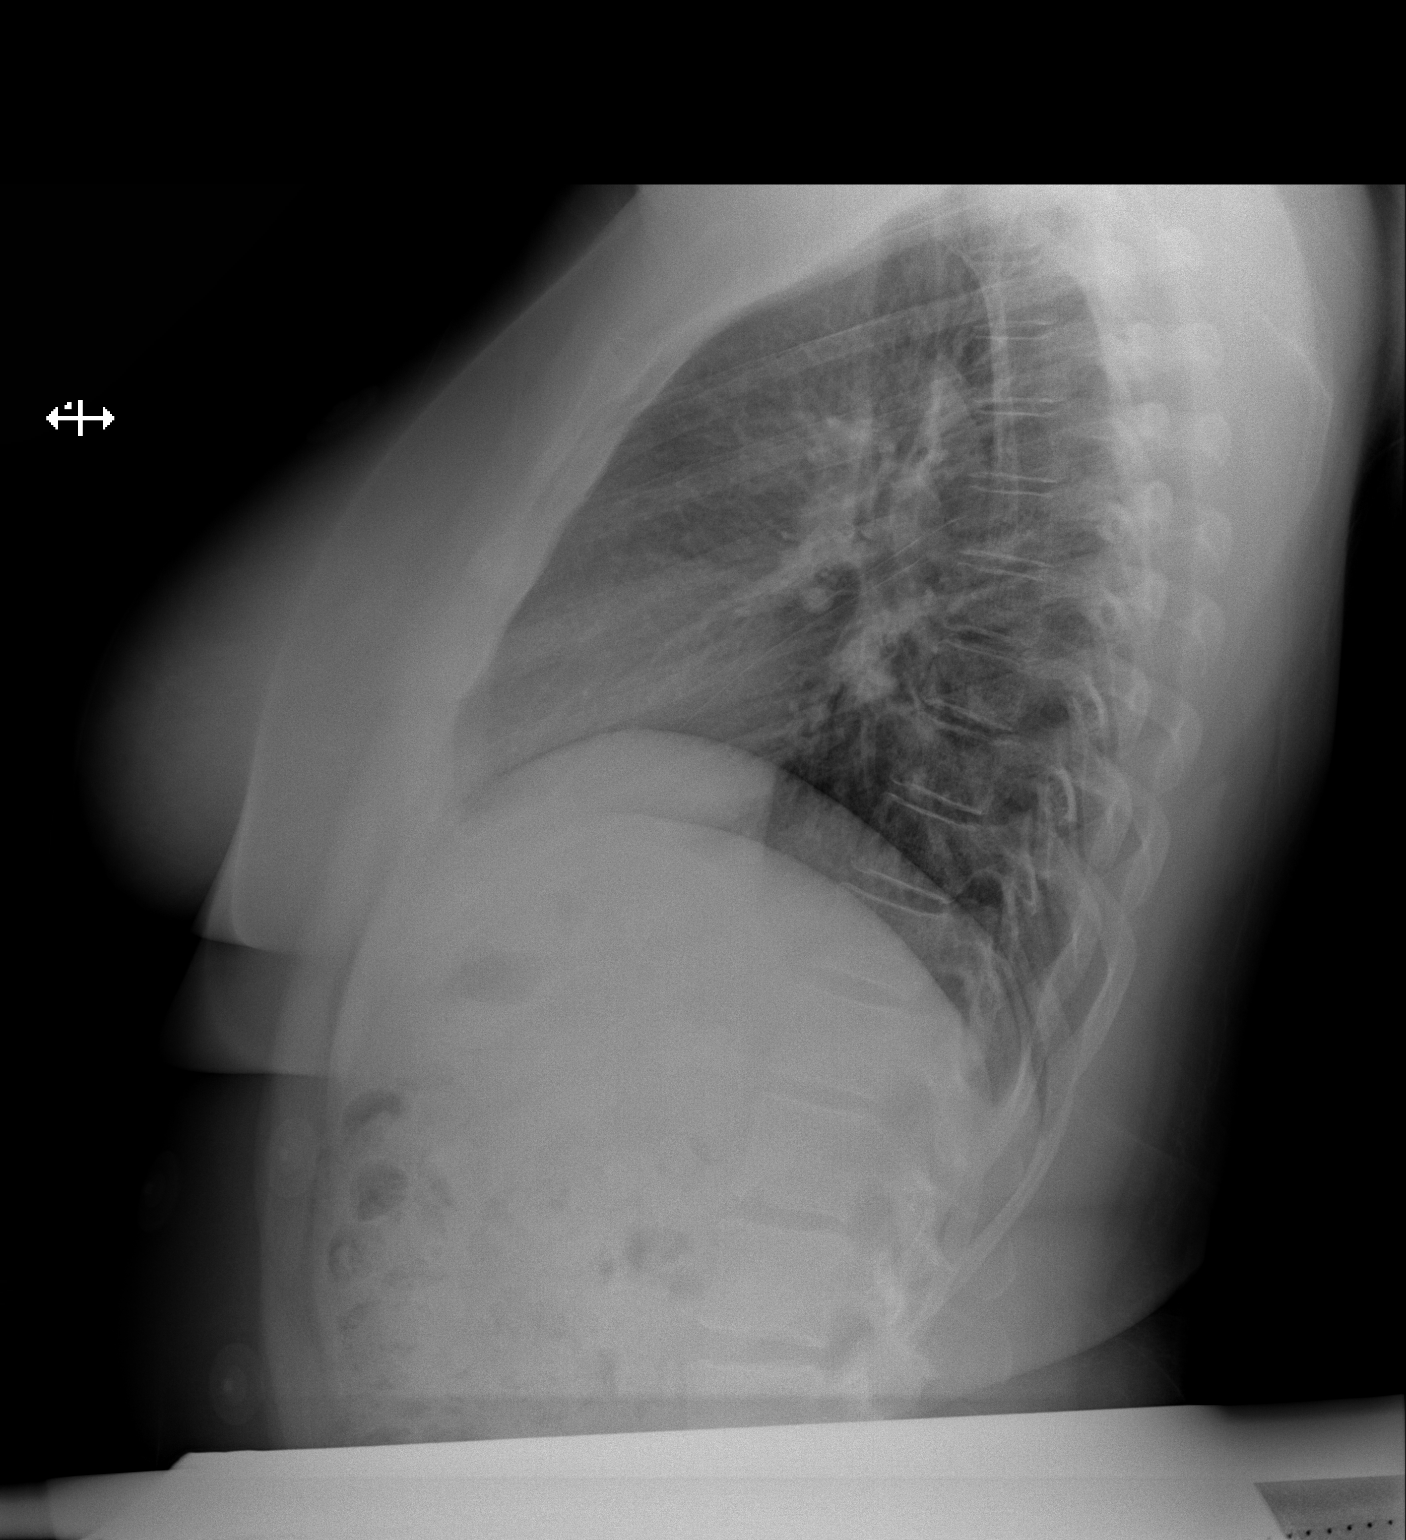

[2 of 2 positions shown; findings below may reference images not displayed]

FINDINGS: The heart at the upper limits of normal in size. Normal mediastinal
contours. Both lungs are clear. The visualized skeletal structures
are unremarkable.
IMPRESSION: No active cardiopulmonary disease.

## 2023-04-11 ENCOUNTER — Emergency Department (HOSPITAL_COMMUNITY)
Admission: EM | Admit: 2023-04-11 | Discharge: 2023-04-11 | Disposition: A | Discharge status: Home / Self Care | Payer: BC Managed Care – PPO | Attending: Emergency Medicine | Admitting: Emergency Medicine

## 2023-04-11 ENCOUNTER — Other Ambulatory Visit: Payer: Self-pay

## 2023-04-11 DIAGNOSIS — T7840XA Allergy, unspecified, initial encounter: Secondary | ICD-10-CM | POA: Insufficient documentation

## 2023-04-11 MED ORDER — METHYLPREDNISOLONE SODIUM SUCC 125 MG IJ SOLR
125.0000 mg | Freq: Once | INTRAMUSCULAR | Status: AC
  Administered 2023-04-11: 125 mg via INTRAVENOUS

## 2023-04-11 MED ORDER — PREDNISONE 20 MG PO TABS: 40.0000 mg | ORAL_TABLET | Freq: Every day | ORAL | 0 refills | Status: AC

## 2023-04-11 MED ORDER — FAMOTIDINE IN NACL 20-0.9 MG/50ML-% IV SOLN
20.0000 mg | Freq: Once | INTRAVENOUS | Status: AC
  Administered 2023-04-11: 20 mg via INTRAVENOUS

## 2023-04-11 NOTE — ED Triage Notes (Signed)
Patient presents to ed via [facility] states patient was fine when she went to bed  woke up about 1 hour ago and it felt like her tongue was swelling and she was having difficulty swallowing. Patient is able to speak on complete sentences. No hives .

## 2023-04-11 NOTE — ED Provider Notes (Signed)
[facility]-EMER[facility]ENCY DEPT [facility] Emergency Department Provider Note MRN:  [id number]  Arrival date & time: 04/11/23     Chief Complaint   Allergic Reaction   History of Present Illness   Judith Moran is a 37 y.o. year-old female presents to the ED with chief complaint of tongue swelling.  She states that she woke up with the symptoms around 1am.  She states that itching of her bilateral palm and feet woke her up.  She then noticed that her tongue seemed swollen.  She took some benadryl and came to the ER when she had trouble swallowing.  She denies any stings or bites.  Denies any new contacts with potential allergens.  Denies any foods.  She doesn't take any medications.  Given 50mg  benadryl by EMS.  History provided by patient.   Review of Systems  Pertinent positive and negative review of systems noted in HPI.    Physical Exam   Vitals:   04/11/23 0350 04/11/23 0500  BP: (!) 133/100 (!) 131/92  Pulse: 77 83  Resp: 18 18  Temp: 97.9 F (36.6 C)   SpO2: 100% 100%    CONSTITUTIONAL:  well-appearing, NAD NEURO:  Alert and oriented x 3, CN 3-12 grossly intact EYES:  eyes equal and reactive ENT/NECK:  Supple, no stridor, no significant swelling noted to the tongue or lips, oropharynx is normal, no stridor CARDIO:  normal rate, regular rhythm, appears well-perfused  PULM:  No respiratory distress, CTAB GI/GU:  non-distended,  MSK/SPINE:  No gross deformities, no edema, moves all extremities  SKIN:  no rash, atraumatic   *Additional and/or pertinent findings included in MDM below  Diagnostic and Interventional Summary    EKG Interpretation Date/Time:    Ventricular Rate:    PR Interval:    QRS Duration:    QT Interval:    QTC Calculation:   R Axis:      Text Interpretation:         Labs Reviewed - No data to display  No orders to display    Medications  methylPREDNISolone sodium succinate (SOLU-MEDROL) 125 mg/2 mL injection 125 mg (125 mg Intravenous  Given 04/11/23 0505)  famotidine (PEPCID) IVPB 20 mg premix (0 mg Intravenous Stopped 04/11/23 4010)     Procedures  /  Critical Care Procedures  ED Course and Medical Decision Making  I have reviewed the triage vital signs, the nursing notes, and pertinent available records from the EMR.  Social Determinants Affecting Complexity of Care: Patient has no clinically significant social determinants affecting this chief complaint..   ED Course:    Medical Decision Making Patient here with allergic reaction.  Unknown etiology.  Doesn't seem to be anaphylactic. Has already had benadryl.  Will give solumedrol and pepcid.   6:44 AM Patient reassessed.  She states that she is feeling much better.  Will discharge home with allergist followup.  Will Rx prednisone.  Risk Prescription drug management.         Consultants: No consultations were needed in caring for this patient.   Treatment and Plan: I considered admission due to patient's initial presentation, but after considering the examination and diagnostic results, patient will not require admission and can be discharged with outpatient follow-up.    Final Clinical Impressions(s) / ED Diagnoses     ICD-10-CM   1. Allergic reaction, initial encounter  T78.40XA       ED Discharge Orders          Ordered    predniSONE (  DELTASONE) 20 MG tablet  Daily        04/11/23 0636              Discharge Instructions Discussed with and Provided to Patient:   Discharge Instructions   None      Roxy Horseman, PA-C 04/11/23 0645    Mesner, Barbara Cower, MD 04/11/23 2259
